# Patient Record
Sex: Male | Born: 1970 | Race: Black or African American | Hispanic: No | Marital: Single | Smoking: Never smoker
Health system: Southern US, Community
[De-identification: ages and names within clinical notes are randomized; demographics above are authoritative.]

## PROBLEM LIST (undated history)

## (undated) DIAGNOSIS — M1990 Unspecified osteoarthritis, unspecified site: Secondary | ICD-10-CM

## (undated) DIAGNOSIS — G709 Myoneural disorder, unspecified: Secondary | ICD-10-CM

## (undated) DIAGNOSIS — T7840XA Allergy, unspecified, initial encounter: Secondary | ICD-10-CM

## (undated) HISTORY — DX: Myoneural disorder, unspecified: G70.9

## (undated) HISTORY — DX: Allergy, unspecified, initial encounter: T78.40XA

## (undated) HISTORY — DX: Unspecified osteoarthritis, unspecified site: M19.90

---

## 1999-06-08 ENCOUNTER — Emergency Department (HOSPITAL_COMMUNITY): Admission: EM | Admit: 1999-06-08 | Discharge: 1999-06-08 | Payer: Self-pay | Admitting: Emergency Medicine

## 2002-07-20 ENCOUNTER — Emergency Department (HOSPITAL_COMMUNITY): Admission: EM | Admit: 2002-07-20 | Discharge: 2002-07-21 | Payer: Self-pay | Admitting: Emergency Medicine

## 2002-07-21 ENCOUNTER — Encounter: Payer: Self-pay | Admitting: Emergency Medicine

## 2010-01-30 ENCOUNTER — Emergency Department (HOSPITAL_COMMUNITY): Admission: EM | Admit: 2010-01-30 | Discharge: 2010-01-30 | Payer: Self-pay | Admitting: Emergency Medicine

## 2011-02-23 LAB — COMPREHENSIVE METABOLIC PANEL
AST: 29 U/L (ref 0–37)
Albumin: 4.3 g/dL (ref 3.5–5.2)
BUN: 7 mg/dL (ref 6–23)
Calcium: 8.9 mg/dL (ref 8.4–10.5)
Creatinine, Ser: 1.41 mg/dL (ref 0.4–1.5)
GFR calc non Af Amer: 56 mL/min — ABNORMAL LOW (ref >60)

## 2011-02-23 LAB — ABO/RH: ABO/RH(D): A POS

## 2011-02-23 LAB — POCT I-STAT, CHEM 8
Glucose, Bld: 91 mg/dL (ref 70–99)
HCT: 47 % (ref 39.0–52.0)
Potassium: 3.2 mEq/L — ABNORMAL LOW (ref 3.5–5.1)
Sodium: 138 mEq/L (ref 135–145)
TCO2: 26 mmol/L (ref 0–100)

## 2011-02-23 LAB — CBC
HCT: 44.2 % (ref 39.0–52.0)
Hemoglobin: 15.3 g/dL (ref 13.0–17.0)
MCHC: 34.6 g/dL (ref 30.0–36.0)
MCV: 91.3 fL (ref 78.0–100.0)
RBC: 4.84 MIL/uL (ref 4.22–5.81)
RDW: 13.1 % (ref 11.5–15.5)
WBC: 4.4 10*3/uL (ref 4.0–10.5)

## 2011-02-23 LAB — TYPE AND SCREEN
ABO/RH(D): A POS
Antibody Screen: NEGATIVE

## 2011-02-23 LAB — LACTIC ACID, PLASMA: Lactic Acid, Venous: 2.5 mmol/L — ABNORMAL HIGH (ref 0.5–2.2)

## 2011-02-23 LAB — PROTIME-INR: Prothrombin Time: 14 seconds (ref 11.6–15.2)

## 2011-12-27 ENCOUNTER — Emergency Department (HOSPITAL_COMMUNITY)
Admission: EM | Admit: 2011-12-27 | Discharge: 2011-12-27 | Disposition: A | Discharge status: Home / Self Care | Payer: No Typology Code available for payment source | Attending: Emergency Medicine | Admitting: Emergency Medicine

## 2011-12-27 ENCOUNTER — Emergency Department (HOSPITAL_COMMUNITY): Payer: No Typology Code available for payment source

## 2011-12-27 ENCOUNTER — Encounter (HOSPITAL_COMMUNITY): Payer: Self-pay

## 2011-12-27 DIAGNOSIS — M542 Cervicalgia: Secondary | ICD-10-CM | POA: Insufficient documentation

## 2011-12-27 DIAGNOSIS — M5380 Other specified dorsopathies, site unspecified: Secondary | ICD-10-CM | POA: Insufficient documentation

## 2011-12-27 MED ORDER — DIAZEPAM 5 MG PO TABS: 5.0000 mg | ORAL_TABLET | Freq: Three times a day (TID) | ORAL | Status: AC | PRN

## 2011-12-27 MED ORDER — IBUPROFEN 800 MG PO TABS: 800.0000 mg | ORAL_TABLET | Freq: Three times a day (TID) | ORAL | Status: AC | PRN
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
## 2011-12-28 NOTE — ED Provider Notes (Signed)
Medical screening examination/treatment/procedure(s) were performed by non-physician practitioner and as supervising physician I was immediately available for consultation/collaboration.  Celene Kras, MD 12/28/11 0800

## 2015-07-05 ENCOUNTER — Ambulatory Visit (INDEPENDENT_AMBULATORY_CARE_PROVIDER_SITE_OTHER): Payer: BLUE CROSS/BLUE SHIELD

## 2015-07-05 ENCOUNTER — Ambulatory Visit (INDEPENDENT_AMBULATORY_CARE_PROVIDER_SITE_OTHER): Payer: BLUE CROSS/BLUE SHIELD | Admitting: Physician Assistant

## 2015-07-05 VITALS — BP 110/72 | HR 73 | Temp 98.8°F | Resp 20 | Ht 67.5 in | Wt 187.2 lb

## 2015-07-05 DIAGNOSIS — K5900 Constipation, unspecified: Secondary | ICD-10-CM | POA: Diagnosis not present

## 2015-07-05 DIAGNOSIS — M62838 Other muscle spasm: Secondary | ICD-10-CM

## 2015-07-05 DIAGNOSIS — M25512 Pain in left shoulder: Secondary | ICD-10-CM

## 2015-07-05 DIAGNOSIS — M545 Low back pain, unspecified: Secondary | ICD-10-CM

## 2015-07-05 MED ORDER — CYCLOBENZAPRINE HCL ER 15 MG PO CP24: 15.0000 mg | ORAL_CAPSULE | Freq: Every day | ORAL | Status: DC | PRN

## 2015-07-05 MED ORDER — NAPROXEN SODIUM 550 MG PO TABS: 550.0000 mg | ORAL_TABLET | Freq: Two times a day (BID) | ORAL | Status: DC

## 2015-07-05 MED ORDER — POLYETHYLENE GLYCOL 3350 17 GM/SCOOP PO POWD: 1.0000 | Freq: Once | ORAL | Status: DC
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
## 2015-07-05 NOTE — Patient Instructions (Signed)
Take anaprox twice a day for 1-2 weeks. Take amrix at night for 1-2 weeks. Apply ice alternating with heat Do not use anaprox with other products containing ibuprofen, naprosyn or aspirin. You may use tylenol with this medication. If your pain is not improving in 2 weeks, let me know and I will refer you to ortho. Return for a physical.

## 2015-07-06 LAB — COMPREHENSIVE METABOLIC PANEL
ALBUMIN: 4.2 g/dL (ref 3.6–5.1)
ALK PHOS: 46 U/L (ref 40–115)
ALT: 17 U/L (ref 9–46)
AST: 27 U/L (ref 10–40)
BILIRUBIN TOTAL: 0.6 mg/dL (ref 0.2–1.2)
BUN: 10 mg/dL (ref 7–25)
CALCIUM: 9.5 mg/dL (ref 8.6–10.3)
CHLORIDE: 104 mmol/L (ref 98–110)
CO2: 23 mmol/L (ref 20–31)
CREATININE: 1.14 mg/dL (ref 0.60–1.35)
Glucose, Bld: 83 mg/dL (ref 65–99)
Potassium: 4.6 mmol/L (ref 3.5–5.3)
SODIUM: 138 mmol/L (ref 135–146)
Total Protein: 7.6 g/dL (ref 6.1–8.1)

## 2015-07-06 LAB — CBC
HCT: 42.9 % (ref 39.0–52.0)
Hemoglobin: 14.5 g/dL (ref 13.0–17.0)
MCH: 29.5 pg (ref 26.0–34.0)
MCHC: 33.8 g/dL (ref 30.0–36.0)
MCV: 87.2 fL (ref 78.0–100.0)
MPV: 9.6 fL (ref 8.6–12.4)
Platelets: 300 10*3/uL (ref 150–400)
RBC: 4.92 MIL/uL (ref 4.22–5.81)
RDW: 14.4 % (ref 11.5–15.5)
WBC: 6.2 10*3/uL (ref 4.0–10.5)

## 2015-07-14 ENCOUNTER — Ambulatory Visit (INDEPENDENT_AMBULATORY_CARE_PROVIDER_SITE_OTHER): Payer: BLUE CROSS/BLUE SHIELD | Admitting: Family Medicine

## 2015-07-14 VITALS — BP 118/70 | HR 69 | Temp 98.4°F | Resp 14 | Ht 67.5 in | Wt 199.0 lb

## 2015-07-14 DIAGNOSIS — Z125 Encounter for screening for malignant neoplasm of prostate: Secondary | ICD-10-CM | POA: Diagnosis not present

## 2015-07-14 DIAGNOSIS — S46912A Strain of unspecified muscle, fascia and tendon at shoulder and upper arm level, left arm, initial encounter: Secondary | ICD-10-CM | POA: Diagnosis not present

## 2015-07-14 DIAGNOSIS — Z23 Encounter for immunization: Secondary | ICD-10-CM

## 2015-07-14 DIAGNOSIS — Z0000 Encounter for general adult medical examination without abnormal findings: Secondary | ICD-10-CM

## 2015-07-14 DIAGNOSIS — Z13220 Encounter for screening for lipoid disorders: Secondary | ICD-10-CM | POA: Diagnosis not present

## 2015-07-14 LAB — LDL CHOLESTEROL, DIRECT: Direct LDL: 166 mg/dL — ABNORMAL HIGH (ref <130)

## 2015-07-14 NOTE — Patient Instructions (Addendum)
We will refer you to orthopedics to look at your shoulder.   I will check your cholesterol and prostate blood tests today I will send your FLMA forms to your job via our disability department.  Avoid lifting overhead,and also avoid lifting over 20 lbs

## 2015-07-14 NOTE — Progress Notes (Signed)
[facility] [address] [place] [address] [phone]  Date:  07/14/2015   Name:  Darius Barry   DOB:  1971-06-19   MRN:  [id number]  PCP:  Default, Provider, MD    Chief Complaint: Annual Exam   History of Present Illness:  Darius Barry is a 44 y.o. very pleasant male patient who presents with the following:  Here today for a "physical" and FLMA paperwork.  Recent labs earlier this month looked good.  He is not fasting today.  He is unsure of the date of last tetanus shot- would like to do today He was here at the first of August for shoulder pain- his shoulder is somewhat improved per his history but not yet well. 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  He does a lot of" manuvering and some lifting but not heavy lifting." He was seen for the same on 8/1, started on amrix and naproxen, planned to refer to ortho if not better.  He does not feel that he is yet able to RTW He is otherwise not aware of any chronic health issues.  He does not have chronic medications  There are no active problems to display for this patient.   Past Medical History  Diagnosis Date  . Allergy   . Arthritis   . Neuromuscular disorder     History reviewed. No pertinent past surgical history.  Social History  Substance Use Topics  . Smoking status: Never Smoker   . Smokeless tobacco: None     Comment: smokes the occasional cigar  . Alcohol Use: 0.0 oz/week    0 Standard drinks or equivalent per week    History reviewed. No pertinent family history.  No Known Allergies  Medication list has been reviewed and updated.  Current Outpatient Prescriptions on File Prior to Visit  Medication Sig Dispense Refill  . acetaminophen (TYLENOL) 500 MG tablet Take 500-1,000 mg by mouth every 6 (six) hours as needed.    . cyclobenzaprine (AMRIX) 15 MG  24 hr capsule Take 1 capsule (15 mg total) by mouth daily as needed for muscle spasms. 30 capsule 0  . naproxen sodium (ANAPROX DS) 550 MG tablet Take 1 tablet (550 mg total) by mouth 2 (two) times daily with a meal. 30 tablet 0  . polyethylene glycol powder (GLYCOLAX/MIRALAX) powder Take 255 g by mouth once. 250 g 0   No current facility-administered medications on file prior to visit.    Review of Systems:  As per HPI- otherwise negative.   Physical Examination: Filed Vitals:   07/14/15 0951  BP: 118/70  Pulse: 69  Temp: 98.4 F (36.9 C)  Resp: 14   Filed Vitals:   07/14/15 0951  Height: 5' 7.5" (1.715 m)  Weight: 199 lb (90.266 kg)   Body mass index is 30.69 kg/(m^2). Ideal Body Weight: Weight in (lb) to have BMI = 25: 161.7  GEN: WDWN, NAD, Non-toxic, A & O x 3, looks well HEENT: Atraumatic, Normocephalic. Neck supple. No masses, No LAD.  Bilateral TM wnl, oropharynx normal.  PEERL,EOMI.   Ears and Nose: No external deformity. CV: RRR, No M/G/R. No JVD. No thrill. No extra heart sounds. PULM: CTA B, no wheezes, crackles, rhonchi. No retractions. No resp. distress. No accessory muscle use. ABD: S, NT, ND. No rebound.  No HSM. EXTR: No c/c/e NEURO Normal gait.  PSYCH: Normally interactive. Conversant. Not depressed or anxious appearing.  Calm demeanor.  Left shoulder: he notes tenderness over the anterior RCT insertion.  Normal ROM, strength of the shoulder  Assessment and Plan: Immunization due - Plan: Tdap vaccine greater than or equal to 7yo IM  Physical exam  Left shoulder strain, initial encounter - Plan: Ambulatory referral to Orthopedic Surgery  Screening for hyperlipidemia - Plan: LDL cholesterol, direct  Screening for prostate cancer - Plan: PSA   Vague shoulder discomfort.  Advised that it would be wise to get him back to work soon, but we can do restrictions and build in time for ortho and PT visits on his FMLA. He plans to RTW next Monday Did his  tdap, the rest os his labs Will plan further follow- up pending labs.  Signed Abbe Amsterdam, MD

## 2015-07-15 ENCOUNTER — Encounter: Payer: Self-pay | Admitting: Family Medicine

## 2015-07-15 LAB — PSA: PSA: 0.24 ng/mL (ref <=4.00)

## 2016-10-19 ENCOUNTER — Ambulatory Visit (INDEPENDENT_AMBULATORY_CARE_PROVIDER_SITE_OTHER): Payer: BLUE CROSS/BLUE SHIELD | Admitting: Family Medicine

## 2016-10-19 VITALS — BP 126/74 | HR 62 | Temp 98.4°F | Resp 18 | Ht 67.5 in | Wt 197.0 lb

## 2016-10-19 DIAGNOSIS — Z0000 Encounter for general adult medical examination without abnormal findings: Secondary | ICD-10-CM

## 2016-10-19 DIAGNOSIS — Z125 Encounter for screening for malignant neoplasm of prostate: Secondary | ICD-10-CM

## 2016-10-19 DIAGNOSIS — Z23 Encounter for immunization: Secondary | ICD-10-CM

## 2016-10-19 DIAGNOSIS — E785 Hyperlipidemia, unspecified: Secondary | ICD-10-CM

## 2016-10-19 DIAGNOSIS — Z113 Encounter for screening for infections with a predominantly sexual mode of transmission: Secondary | ICD-10-CM

## 2016-10-19 LAB — COMPREHENSIVE METABOLIC PANEL
ALBUMIN: 4.3 g/dL (ref 3.6–5.1)
ALT: 20 U/L (ref 9–46)
AST: 28 U/L (ref 10–40)
Alkaline Phosphatase: 43 U/L (ref 40–115)
BILIRUBIN TOTAL: 1.1 mg/dL (ref 0.2–1.2)
BUN: 18 mg/dL (ref 7–25)
CHLORIDE: 103 mmol/L (ref 98–110)
CO2: 27 mmol/L (ref 20–31)
CREATININE: 1.13 mg/dL (ref 0.60–1.35)
Calcium: 9.5 mg/dL (ref 8.6–10.3)
Glucose, Bld: 93 mg/dL (ref 65–99)
Potassium: 4.3 mmol/L (ref 3.5–5.3)
SODIUM: 137 mmol/L (ref 135–146)
TOTAL PROTEIN: 7.5 g/dL (ref 6.1–8.1)

## 2016-10-19 LAB — LIPID PANEL
CHOLESTEROL: 224 mg/dL — AB (ref <200)
HDL: 57 mg/dL (ref >40)
LDL Cholesterol: 155 mg/dL — ABNORMAL HIGH (ref <100)
TRIGLYCERIDES: 62 mg/dL (ref <150)
Total CHOL/HDL Ratio: 3.9 Ratio (ref <5.0)
VLDL: 12 mg/dL (ref <30)

## 2016-10-19 LAB — HIV ANTIBODY (ROUTINE TESTING W REFLEX): HIV 1&2 Ab, 4th Generation: NONREACTIVE

## 2016-10-19 NOTE — Progress Notes (Signed)
Chief Complaint  Patient presents with  . Annual Exam    Subjective:  Darius Barry is a 45 y.o. male here for a health maintenance visit.  Patient is established pt  There are no active problems to display for this patient.   Past Medical History:  Diagnosis Date  . Allergy   . Arthritis   . Neuromuscular disorder (HCC)     History reviewed. No pertinent surgical history.   Outpatient Medications Prior to Visit  Medication Sig Dispense Refill  . cyclobenzaprine (AMRIX) 15 MG 24 hr capsule Take 1 capsule (15 mg total) by mouth daily as needed for muscle spasms. (Patient not taking: Reported on 10/19/2016) 30 capsule 0  . acetaminophen (TYLENOL) 500 MG tablet Take 500-1,000 mg by mouth every 6 (six) hours as needed.    . naproxen sodium (ANAPROX DS) 550 MG tablet Take 1 tablet (550 mg total) by mouth 2 (two) times daily with a meal. (Patient not taking: Reported on 10/19/2016) 30 tablet 0  . polyethylene glycol powder (GLYCOLAX/MIRALAX) powder Take 255 g by mouth once. (Patient not taking: Reported on 10/19/2016) 250 g 0   No facility-administered medications prior to visit.     No Known Allergies   History reviewed. No pertinent family history.   Health Habits: Dental Exam: up to date Eye Exam: up to date Exercise:  times/week on average Current exercise activities: walking/running Diet: balanced Tobacco: none  Social History   Social History  . Marital status: Single    Spouse name: N/A  . Number of children: N/A  . Years of education: N/A   Occupational History  . Not on file.   Social History Main Topics  . Smoking status: Never Smoker  . Smokeless tobacco: Never Used     Comment: smokes the occasional cigar  . Alcohol use 0.0 oz/week  . Drug use: No  . Sexual activity: Not on file   Other Topics Concern  . Not on file   Social History Narrative  . No narrative on file   History  Alcohol Use  . 0.0 oz/week   History  Smoking Status  .  Never Smoker  Smokeless Tobacco  . Never Used    Comment: smokes the occasional cigar   History  Drug Use No     Health Maintenance: See under health Maintenance activity for review of completion dates as well. Immunization History  Administered Date(s) Administered  . Influenza,inj,Quad PF,36+ Mos 10/19/2016  . Tdap 07/14/2015      Depression Screen-PHQ2/9 [facility]2016 07/14/2015  Decreased Interest 0 0  Down, Depressed, Hopeless 0 0  PHQ - 2 Score 0 0      Depression Severity and Treatment Recommendations:  0-4= None  5-9= Mild / Treatment: Support, educate to call if worse; return in one month  10-14= Moderate / Treatment: Support, watchful waiting; Antidepressant or Psycotherapy  15-19= Moderately severe / Treatment: Antidepressant OR Psychotherapy  >= 20 = Major depression, severe / Antidepressant AND Psychotherapy    Review of Systems   Review of Systems  Constitutional: Negative for chills, fever and weight loss.  HENT: Negative for congestion, hearing loss and nosebleeds.   Eyes: Negative for blurred vision and double vision.  Respiratory: Negative for cough, hemoptysis and wheezing.   Cardiovascular: Negative for chest pain, palpitations and orthopnea.  Gastrointestinal: Negative for abdominal pain, blood in stool, nausea and vomiting.  Genitourinary: Negative for dysuria, frequency and urgency.  Musculoskeletal: Negative for back pain,  joint pain, myalgias and neck pain.  Skin: Negative for itching and rash.  Neurological: Negative for dizziness, tingling, tremors and headaches.  Psychiatric/Behavioral: Negative for depression. The patient is not nervous/anxious and does not have insomnia.     See HPI for ROS as well.    Objective:   Vitals:   10/19/16 0832  BP: 126/74  Pulse: 62  Resp: 18  Temp: 98.4 F (36.9 C)  TempSrc: Oral  SpO2: 99%  Weight: 197 lb (89.4 kg)  Height: 5' 7.5" (1.715 m)    Body mass index is  30.4 kg/m.  Physical Exam  Constitutional: He is oriented to person, place, and time. He appears well-developed and well-nourished.  HENT:  Head: Normocephalic and atraumatic.  Eyes: Conjunctivae and EOM are normal. Pupils are equal, round, and reactive to light.  Neck: Normal range of motion. Neck supple.  Cardiovascular: Normal rate, regular rhythm and normal heart sounds.   No murmur heard. Pulmonary/Chest: Effort normal and breath sounds normal. No respiratory distress. He has no wheezes.  Abdominal: Soft. Bowel sounds are normal. He exhibits no distension and no mass. There is no tenderness. There is no rebound and no guarding.  Musculoskeletal: Normal range of motion. He exhibits no edema.  Neurological: He is alert and oriented to person, place, and time. He has normal reflexes. No cranial nerve deficit.  Skin: Skin is warm. No erythema.  Psychiatric: He has a normal mood and affect. His behavior is normal. Judgment and thought content normal.       Assessment/Plan:   Patient was seen for a health maintenance exam.  Counseled the patient on health maintenance issues. Reviewed her health mainteance schedule and ordered appropriate tests (see orders.) Counseled on regular exercise and weight management. Recommend regular eye exams and dental cleaning.   The following issues were addressed today for health maintenance:   Darius Barry was seen today for annual exam.  Diagnoses and all orders for this visit:  Health maintenance examination- age appropriate screenings reviewed Advised eye and dental exam -     Cancel: PSA -     Comprehensive metabolic panel -     Lipid panel  Need for prophylactic vaccination and inoculation against influenza -     Flu Vaccine QUAD 36+ mos IM  Dyslipidemia- discussed lifestyle changes, advised fish oil to improve hdl and fiber to decrease LDL cholesterol  Screening for prostate cancer -     PSA  Screen for STD (sexually transmitted disease)-  verbal consent given for std today -     Cancel: GC/Chlamydia Probe Amp -     HIV antibody -     RPR -     GC/Chlamydia Probe Amp    No Follow-up on file.    Body mass index is 30.4 kg/m.:  Discussed the patient's BMI with patient. The BMI body mass index is 30.4 kg/m.     No future appointments.  Patient Instructions       IF you received an x-ray today, you will receive an invoice from [facility]. Please contact [facility] at [phone] with questions or concerns regarding your invoice.   IF you received labwork today, you will receive an invoice from [employer] [facility]. Please contact [facility] at [phone] with questions or concerns regarding your invoice.   Our billing staff will not be able to assist you with questions regarding bills from these companies.  You will be contacted with the lab results as soon as they are available.  The fastest way to get your results is to activate your My Chart account. Instructions are located on the last page of this paperwork. If you have not heard from [place] regarding the results in 2 weeks, please contact this office.     Dyslipidemia Dyslipidemia is an imbalance of waxy, fat-like substances (lipids) in the blood. The body needs lipids in small amounts. Dyslipidemia often involves a high level of cholesterol or triglycerides, which are types of lipids. Common forms of dyslipidemia include:  High levels of bad cholesterol (LDL cholesterol). LDL is the type of cholesterol that causes fatty deposits (plaques) to build up in the blood vessels that carry blood away from your heart (arteries).  Low levels of good cholesterol (HDL cholesterol). HDL cholesterol is the type of cholesterol that protects against heart disease. High levels of HDL remove the LDL buildup from arteries.  High levels of triglycerides. Triglycerides are a fatty substance in the blood that is linked to a buildup of plaques in  the arteries. You can develop dyslipidemia because of the genes you are born with (primary dyslipidemia) or changes that occur during your life (secondary dyslipidemia), or as a side effect of certain medical treatments. What are the causes? Primary dyslipidemia is caused by changes (mutations) in genes that are passed down through families (inherited). These mutations cause several types of dyslipidemia. Mutations can result in disorders that make the body produce too much LDL cholesterol or triglycerides, or not enough HDL cholesterol. These disorders may lead to heart disease, arterial disease, or stroke at an early age. Causes of secondary dyslipidemia include certain lifestyle choices and diseases that lead to dyslipidemia, such as:  Eating a diet that is high in animal fat.  Not getting enough activity or exercise (having a sedentary lifestyle).  Having diabetes, kidney disease, liver disease, or thyroid disease.  Drinking large amounts of alcohol.  Using certain types of drugs. What increases the risk? You may be at greater risk for dyslipidemia if you are an older man or if you are a woman who has gone through menopause. Other risk factors include:  Having a family history of dyslipidemia.  Taking certain medicines, including birth control pills, steroids, some diuretics, beta-blockers, and some medicines forHIV.  Smoking cigarettes.  Eating a high-fat diet.  Drinking large amounts of alcohol.  Having certain medical conditions such as diabetes, polycystic ovary syndrome (PCOS), pregnancy, kidney disease, liver disease, or hypothyroidism.  Not exercising regularly.  Being overweight or obese with too much belly fat. What are the signs or symptoms? Dyslipidemia does not usually cause any symptoms. Very high lipid levels can cause fatty bumps under the skin (xanthomas) or a white or gray ring around the black center (pupil) of the eye. Very high triglyceride levels can cause  inflammation of the pancreas (pancreatitis). How is this diagnosed? Your health care provider may diagnose dyslipidemia based on a routine blood test (fasting blood test). Because most people do not have symptoms of the condition, this blood testing (lipid profile) is done on adults age 67 and older and is repeated every 5 years. This test checks:  Total cholesterol. This is a measure of the total amount of cholesterol in your blood, including LDL cholesterol, HDL cholesterol, and triglycerides. A healthy number is below 200.  LDL cholesterol. The target number for LDL cholesterol is different for each person, depending on individual risk factors. For most people, a number below 100 is healthy. Ask your health care provider what your LDL cholesterol  number should be.  HDL cholesterol. An HDL level of 60 or higher is best because it helps to protect against heart disease. A number below 40 for men or below 50 for women increases the risk for heart disease.  Triglycerides. A healthy triglyceride number is below 150. If your lipid profile is abnormal, your health care provider may do other blood tests to get more information about your condition. How is this treated? Treatment depends on the type of dyslipidemia that you have and your other risk factors for heart disease and stroke. Your health care provider will have a target range for your lipid levels based on this information. For many people, treatment starts with lifestyle changes, such as diet and exercise. Your health care provider may recommend that you:  Get regular exercise.  Make changes to your diet.  Quit smoking if you smoke. If diet changes and exercise do not help you reach your goals, your health care provider may also prescribe medicine to lower lipids. The most commonly prescribed type of medicine lowers your LDL cholesterol (statin drug). If you have a high triglyceride level, your provider may prescribe another type of drug  (fibrate) or an omega-3 fish oil supplement, or both. Follow these instructions at home:  Take over-the-counter and prescription medicines only as told by your health care provider. This includes supplements.  Get regular exercise. Start an aerobic exercise and strength training program as told by your health care provider. Ask your health care provider what activities are safe for you. Your health care provider may recommend:  30 minutes of aerobic activity 4-6 days a week. Brisk walking is an example of aerobic activity.  Strength training 2 days a week.  Eat a healthy diet as told by your health care provider. This can help you reach and maintain a healthy weight, lower your LDL cholesterol, and raise your HDL cholesterol. It may help to work with a diet and nutrition specialist (dietitian) to make a plan that is right for you. Your dietitian or health care provider may recommend:  Limiting your calories, if you are overweight.  Eating more fruits, vegetables, whole grains, fish, and lean meats.  Limiting saturated fat, trans fat, and cholesterol.  Follow instructions from your health care provider or dietitian about eating or drinking restrictions.  Limit alcohol intake to no more than one drink per day for nonpregnant women and two drinks per day for men. One drink equals 12 oz of beer, 5 oz of wine, or 1 oz of hard liquor.  Do not use any products that contain nicotine or tobacco, such as cigarettes and e-cigarettes. If you need help quitting, ask your health care provider.  Keep all follow-up visits as told by your health care provider. This is important. Contact a health care provider if:  You are having trouble sticking to your exercise or diet plan.  You are struggling to quit smoking or control your use of alcohol. Summary  Dyslipidemia is an imbalance of waxy, fat-like substances (lipids) in the blood. The body needs lipids in small amounts. Dyslipidemia often involves a  high level of cholesterol or triglycerides, which are types of lipids.  Treatment depends on the type of dyslipidemia that you have and your other risk factors for heart disease and stroke.  For many people, treatment starts with lifestyle changes, such as diet and exercise. Your health care provider may also prescribe medicine to lower lipids. This information is not intended to replace advice given to you  by your health care provider. Make sure you discuss any questions you have with your health care provider. Document Released: 11/25/2013 Document Revised: 07/17/2016 Document Reviewed: 07/17/2016 Elsevier Interactive Patient Education  2017 [employer]

## 2016-10-19 NOTE — Patient Instructions (Addendum)
   IF you received an x-ray today, you will receive an invoice from [facility]. Please contact [facility] at [phone] with questions or concerns regarding your invoice.   IF you received labwork today, you will receive an invoice from [facility]. Please contact [facility] at [phone] with questions or concerns regarding your invoice.   Our billing staff will not be able to assist you with questions regarding bills from these companies.  You will be contacted with the lab results as soon as they are available. The fastest way to get your results is to activate your My Chart account. Instructions are located on the last page of this paperwork. If you have not heard from us regarding the results in 2 weeks, please contact this office.      Dyslipidemia Dyslipidemia is an imbalance of waxy, fat-like substances (lipids) in the blood. The body needs lipids in small amounts. Dyslipidemia often involves a high level of cholesterol or triglycerides, which are types of lipids. Common forms of dyslipidemia include:  High levels of bad cholesterol (LDL cholesterol). LDL is the type of cholesterol that causes fatty deposits (plaques) to build up in the blood vessels that carry blood away from your heart (arteries).  Low levels of good cholesterol (HDL cholesterol). HDL cholesterol is the type of cholesterol that protects against heart disease. High levels of HDL remove the LDL buildup from arteries.  High levels of triglycerides. Triglycerides are a fatty substance in the blood that is linked to a buildup of plaques in the arteries. You can develop dyslipidemia because of the genes you are born with (primary dyslipidemia) or changes that occur during your life (secondary dyslipidemia), or as a side effect of certain medical treatments. What are the causes? Primary dyslipidemia is caused by changes (mutations) in genes that are passed down through  families (inherited). These mutations cause several types of dyslipidemia. Mutations can result in disorders that make the body produce too much LDL cholesterol or triglycerides, or not enough HDL cholesterol. These disorders may lead to heart disease, arterial disease, or stroke at an early age. Causes of secondary dyslipidemia include certain lifestyle choices and diseases that lead to dyslipidemia, such as:  Eating a diet that is high in animal fat.  Not getting enough activity or exercise (having a sedentary lifestyle).  Having diabetes, kidney disease, liver disease, or thyroid disease.  Drinking large amounts of alcohol.  Using certain types of drugs. What increases the risk? You may be at greater risk for dyslipidemia if you are an older man or if you are a woman who has gone through menopause. Other risk factors include:  Having a family history of dyslipidemia.  Taking certain medicines, including birth control pills, steroids, some diuretics, beta-blockers, and some medicines forHIV.  Smoking cigarettes.  Eating a high-fat diet.  Drinking large amounts of alcohol.  Having certain medical conditions such as diabetes, polycystic ovary syndrome (PCOS), pregnancy, kidney disease, liver disease, or hypothyroidism.  Not exercising regularly.  Being overweight or obese with too much belly fat. What are the signs or symptoms? Dyslipidemia does not usually cause any symptoms. Very high lipid levels can cause fatty bumps under the skin (xanthomas) or a white or gray ring around the black center (pupil) of the eye. Very high triglyceride levels can cause inflammation of the pancreas (pancreatitis). How is this diagnosed? Your health care provider may diagnose dyslipidemia based on a routine blood test (fasting blood test). Because most people do not   have symptoms of the condition, this blood testing (lipid profile) is done on adults age 20 and older and is repeated every 5 years.  This test checks:  Total cholesterol. This is a measure of the total amount of cholesterol in your blood, including LDL cholesterol, HDL cholesterol, and triglycerides. A healthy number is below 200.  LDL cholesterol. The target number for LDL cholesterol is different for each person, depending on individual risk factors. For most people, a number below 100 is healthy. Ask your health care provider what your LDL cholesterol number should be.  HDL cholesterol. An HDL level of 60 or higher is best because it helps to protect against heart disease. A number below 40 for men or below 50 for women increases the risk for heart disease.  Triglycerides. A healthy triglyceride number is below 150. If your lipid profile is abnormal, your health care provider may do other blood tests to get more information about your condition. How is this treated? Treatment depends on the type of dyslipidemia that you have and your other risk factors for heart disease and stroke. Your health care provider will have a target range for your lipid levels based on this information. For many people, treatment starts with lifestyle changes, such as diet and exercise. Your health care provider may recommend that you:  Get regular exercise.  Make changes to your diet.  Quit smoking if you smoke. If diet changes and exercise do not help you reach your goals, your health care provider may also prescribe medicine to lower lipids. The most commonly prescribed type of medicine lowers your LDL cholesterol (statin drug). If you have a high triglyceride level, your provider may prescribe another type of drug (fibrate) or an omega-3 fish oil supplement, or both. Follow these instructions at home:  Take over-the-counter and prescription medicines only as told by your health care provider. This includes supplements.  Get regular exercise. Start an aerobic exercise and strength training program as told by your health care provider. Ask  your health care provider what activities are safe for you. Your health care provider may recommend:  30 minutes of aerobic activity 4-6 days a week. Brisk walking is an example of aerobic activity.  Strength training 2 days a week.  Eat a healthy diet as told by your health care provider. This can help you reach and maintain a healthy weight, lower your LDL cholesterol, and raise your HDL cholesterol. It may help to work with a diet and nutrition specialist (dietitian) to make a plan that is right for you. Your dietitian or health care provider may recommend:  Limiting your calories, if you are overweight.  Eating more fruits, vegetables, whole grains, fish, and lean meats.  Limiting saturated fat, trans fat, and cholesterol.  Follow instructions from your health care provider or dietitian about eating or drinking restrictions.  Limit alcohol intake to no more than one drink per day for nonpregnant women and two drinks per day for men. One drink equals 12 oz of beer, 5 oz of wine, or 1 oz of hard liquor.  Do not use any products that contain nicotine or tobacco, such as cigarettes and e-cigarettes. If you need help quitting, ask your health care provider.  Keep all follow-up visits as told by your health care provider. This is important. Contact a health care provider if:  You are having trouble sticking to your exercise or diet plan.  You are struggling to quit smoking or control your use of alcohol.   Summary  Dyslipidemia is an imbalance of waxy, fat-like substances (lipids) in the blood. The body needs lipids in small amounts. Dyslipidemia often involves a high level of cholesterol or triglycerides, which are types of lipids.  Treatment depends on the type of dyslipidemia that you have and your other risk factors for heart disease and stroke.  For many people, treatment starts with lifestyle changes, such as diet and exercise. Your health care provider may also prescribe medicine  to lower lipids. This information is not intended to replace advice given to you by your health care provider. Make sure you discuss any questions you have with your health care provider. Document Released: 11/25/2013 Document Revised: 07/17/2016 Document Reviewed: 07/17/2016 Elsevier Interactive Patient Education  2017 [employer] Inc.  

## 2016-10-20 LAB — RPR

## 2016-10-20 LAB — GC/CHLAMYDIA PROBE AMP
CT PROBE, AMP APTIMA: NOT DETECTED
GC PROBE AMP APTIMA: NOT DETECTED

## 2016-10-20 LAB — PSA: PSA: 0.2 ng/mL (ref <=4.0)

## 2016-10-20 NOTE — Addendum Note (Signed)
Addended by: Collie SiadSTALLINGS, ZOE A on: 10/20/2016 10:01 AM   Modules accepted: Orders

## 2020-03-18 ENCOUNTER — Ambulatory Visit (INDEPENDENT_AMBULATORY_CARE_PROVIDER_SITE_OTHER): Payer: Managed Care, Other (non HMO)

## 2020-03-18 ENCOUNTER — Ambulatory Visit (HOSPITAL_COMMUNITY)
Admission: EM | Admit: 2020-03-18 | Discharge: 2020-03-18 | Disposition: A | Discharge status: Home / Self Care | Payer: Managed Care, Other (non HMO) | Attending: Urgent Care | Admitting: Urgent Care

## 2020-03-18 ENCOUNTER — Encounter (HOSPITAL_COMMUNITY): Payer: Self-pay

## 2020-03-18 ENCOUNTER — Other Ambulatory Visit: Payer: Self-pay

## 2020-03-18 DIAGNOSIS — Z20822 Contact with and (suspected) exposure to covid-19: Secondary | ICD-10-CM | POA: Diagnosis present

## 2020-03-18 DIAGNOSIS — J189 Pneumonia, unspecified organism: Secondary | ICD-10-CM | POA: Diagnosis not present

## 2020-03-18 DIAGNOSIS — R079 Chest pain, unspecified: Secondary | ICD-10-CM | POA: Diagnosis present

## 2020-03-18 DIAGNOSIS — R0989 Other specified symptoms and signs involving the circulatory and respiratory systems: Secondary | ICD-10-CM | POA: Diagnosis present

## 2020-03-18 DIAGNOSIS — R05 Cough: Secondary | ICD-10-CM | POA: Insufficient documentation

## 2020-03-18 DIAGNOSIS — R059 Cough, unspecified: Secondary | ICD-10-CM

## 2020-03-18 MED ORDER — AZITHROMYCIN 250 MG PO TABS: ORAL_TABLET | ORAL | 0 refills | Status: AC

## 2020-03-18 MED ORDER — CEFDINIR 300 MG PO CAPS: 300.0000 mg | ORAL_CAPSULE | Freq: Two times a day (BID) | ORAL | 0 refills | Status: AC

## 2020-03-18 MED ORDER — PROMETHAZINE-DM 6.25-15 MG/5ML PO SYRP: 5.0000 mL | ORAL_SOLUTION | Freq: Every evening | ORAL | 0 refills | Status: AC | PRN

## 2020-03-18 MED ORDER — BENZONATATE 100 MG PO CAPS: 100.0000 mg | ORAL_CAPSULE | Freq: Three times a day (TID) | ORAL | 0 refills | Status: AC | PRN

## 2020-03-18 NOTE — ED Triage Notes (Signed)
Pt presents for covid testing with no known symptoms. 

## 2020-03-18 NOTE — ED Provider Notes (Signed)
[facility]   MRN: [id number] DOB: August 01, 1971  Subjective:   Darius Barry is a 49 y.o. male presenting for 2-week history of persistent and worsening cough that elicits chest pain and shortness of breath.  Patient has also had chills.  Has had multiple COVID-19 contacts at work.  Denies smoking history.  Denies history of COPD, asthma.  He does have a history of allergies but per patient are very mild.   No current facility-administered medications for this encounter. No current outpatient medications on file.   No Known Allergies  Past Medical History:  Diagnosis Date  . Allergy   . Arthritis   . Neuromuscular disorder (HCC)      History reviewed. No pertinent surgical history.  History reviewed. No pertinent family history.  Social History   Tobacco Use  . Smoking status: Never Smoker  . Smokeless tobacco: Never Used  . Tobacco comment: smokes the occasional cigar  Substance Use Topics  . Alcohol use: Yes    Alcohol/week: 0.0 standard drinks  . Drug use: No    ROS   Objective:   Vitals: BP 126/71 (BP Location: Left Arm)   Pulse 60   Temp 98.7 F (37.1 C) (Oral)   Resp 18   SpO2 98%   Physical Exam Constitutional:      General: He is not in acute distress.    Appearance: Normal appearance. He is well-developed. He is not ill-appearing, toxic-appearing or diaphoretic.  HENT:     Head: Normocephalic and atraumatic.     Right Ear: External ear normal.     Left Ear: External ear normal.     Nose: Nose normal.     Mouth/Throat:     Mouth: Mucous membranes are moist.     Pharynx: Oropharynx is clear.  Eyes:     General: No scleral icterus.    Extraocular Movements: Extraocular movements intact.     Pupils: Pupils are equal, round, and reactive to light.  Cardiovascular:     Rate and Rhythm: Normal rate and regular rhythm.     Heart sounds: Normal heart sounds. No murmur. No friction rub. No gallop.   Pulmonary:     Effort: Pulmonary  effort is normal. No respiratory distress.     Breath sounds: No stridor. Examination of the left-middle field reveals rales. Examination of the left-lower field reveals rales. Rales present. No wheezing or rhonchi.  Neurological:     Mental Status: He is alert and oriented to person, place, and time.  Psychiatric:        Mood and Affect: Mood normal.        Behavior: Behavior normal.        Thought Content: Thought content normal.     DG Chest 2 View  Result Date: 03/18/2020 CLINICAL DATA:  Cough, chest pain. EXAM: CHEST - 2 VIEW COMPARISON:  January 30, 2010. FINDINGS: The heart size and mediastinal contours are within normal limits. Both lungs are clear. No pneumothorax or pleural effusion is noted. The visualized skeletal structures are unremarkable. IMPRESSION: No active cardiopulmonary disease. Electronically Signed   By: Lupita Raider M.D.   On: 03/18/2020 13:40     Assessment and Plan :   PDMP not reviewed this encounter.  1. Community acquired pneumonia of left lung, unspecified part of lung   2. Exposure to COVID-19 virus   3. Chest rales   4. Cough   5. Chest pain, unspecified type     Given patient's lung  exam and symptoms that will cover for community-acquired pneumonia with cefdinir and azithromycin as per up-to-date.  Recommended supportive care, cough suppression medications.  COVID-19 testing is pending. Counseled patient on potential for adverse effects with medications prescribed/recommended today, ER and return-to-clinic precautions discussed, patient verbalized understanding.    Jaynee Eagles, PA-C 03/18/20 [id number]

## 2020-03-18 NOTE — Discharge Instructions (Addendum)

## 2020-03-19 LAB — SARS CORONAVIRUS 2 (TAT 6-24 HRS): SARS Coronavirus 2: NEGATIVE

## 2020-11-22 ENCOUNTER — Ambulatory Visit: Payer: Managed Care, Other (non HMO)

## 2020-11-22 ENCOUNTER — Ambulatory Visit: Payer: Managed Care, Other (non HMO) | Attending: Critical Care Medicine

## 2020-11-22 ENCOUNTER — Other Ambulatory Visit: Payer: Self-pay

## 2020-11-22 NOTE — Progress Notes (Signed)
   Covid-19 Vaccination Clinic  Name:  UZAIR GODLEY    MRN: [id number] DOB: 11/04/1971  11/22/2020  Mr. Bruntz was observed post Covid-19 immunization for 15 minutes without incident. He was provided with Vaccine Information Sheet and instruction to access the V-Safe system.   Mr. Harvel was instructed to call 911 with any severe reactions post vaccine: Marland Kitchen Difficulty breathing  . Swelling of face and throat  . A fast heartbeat  . A bad rash all over body  . Dizziness and weakness

## 2021-10-01 IMAGING — DX DG CHEST 2V
2 series · 2 of 2 positions shown · non-contrast
Comparison: January 30, 2010.

CLINICAL DATA: Cough, chest pain.

EXAM:
CHEST - 2 VIEW

[chest pa]
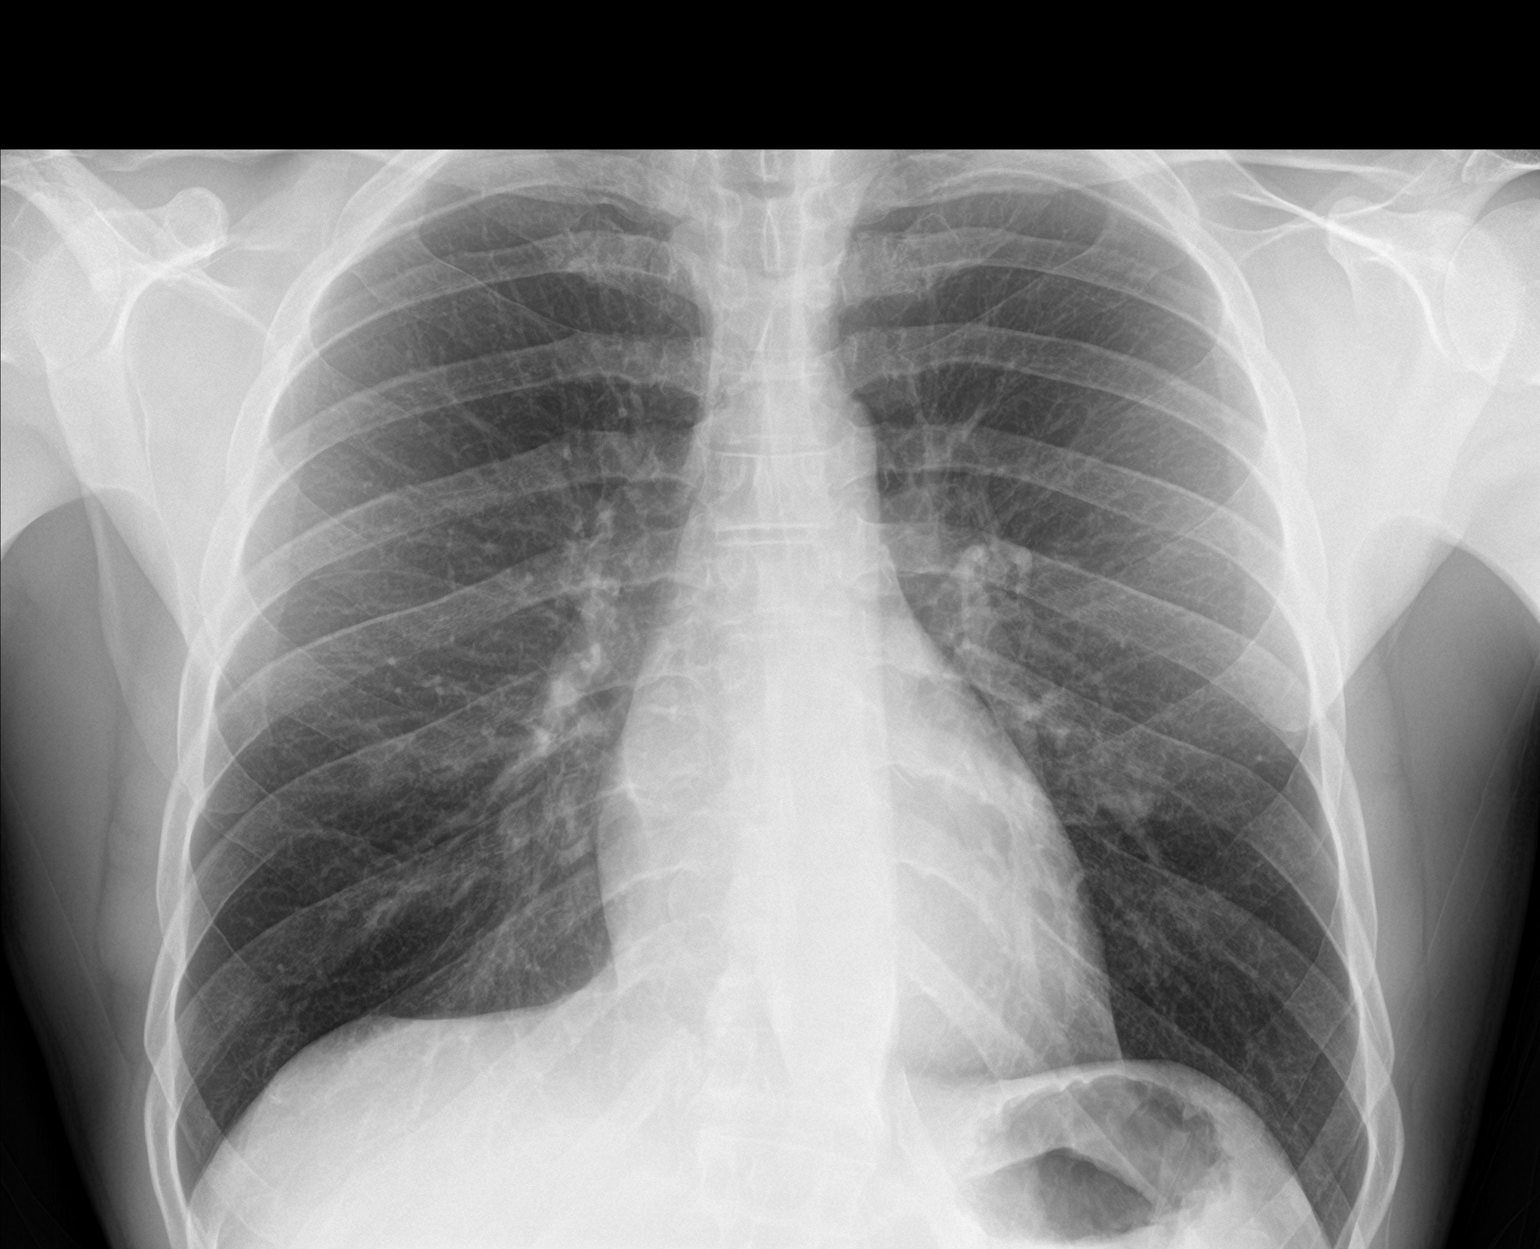

[chest lat]
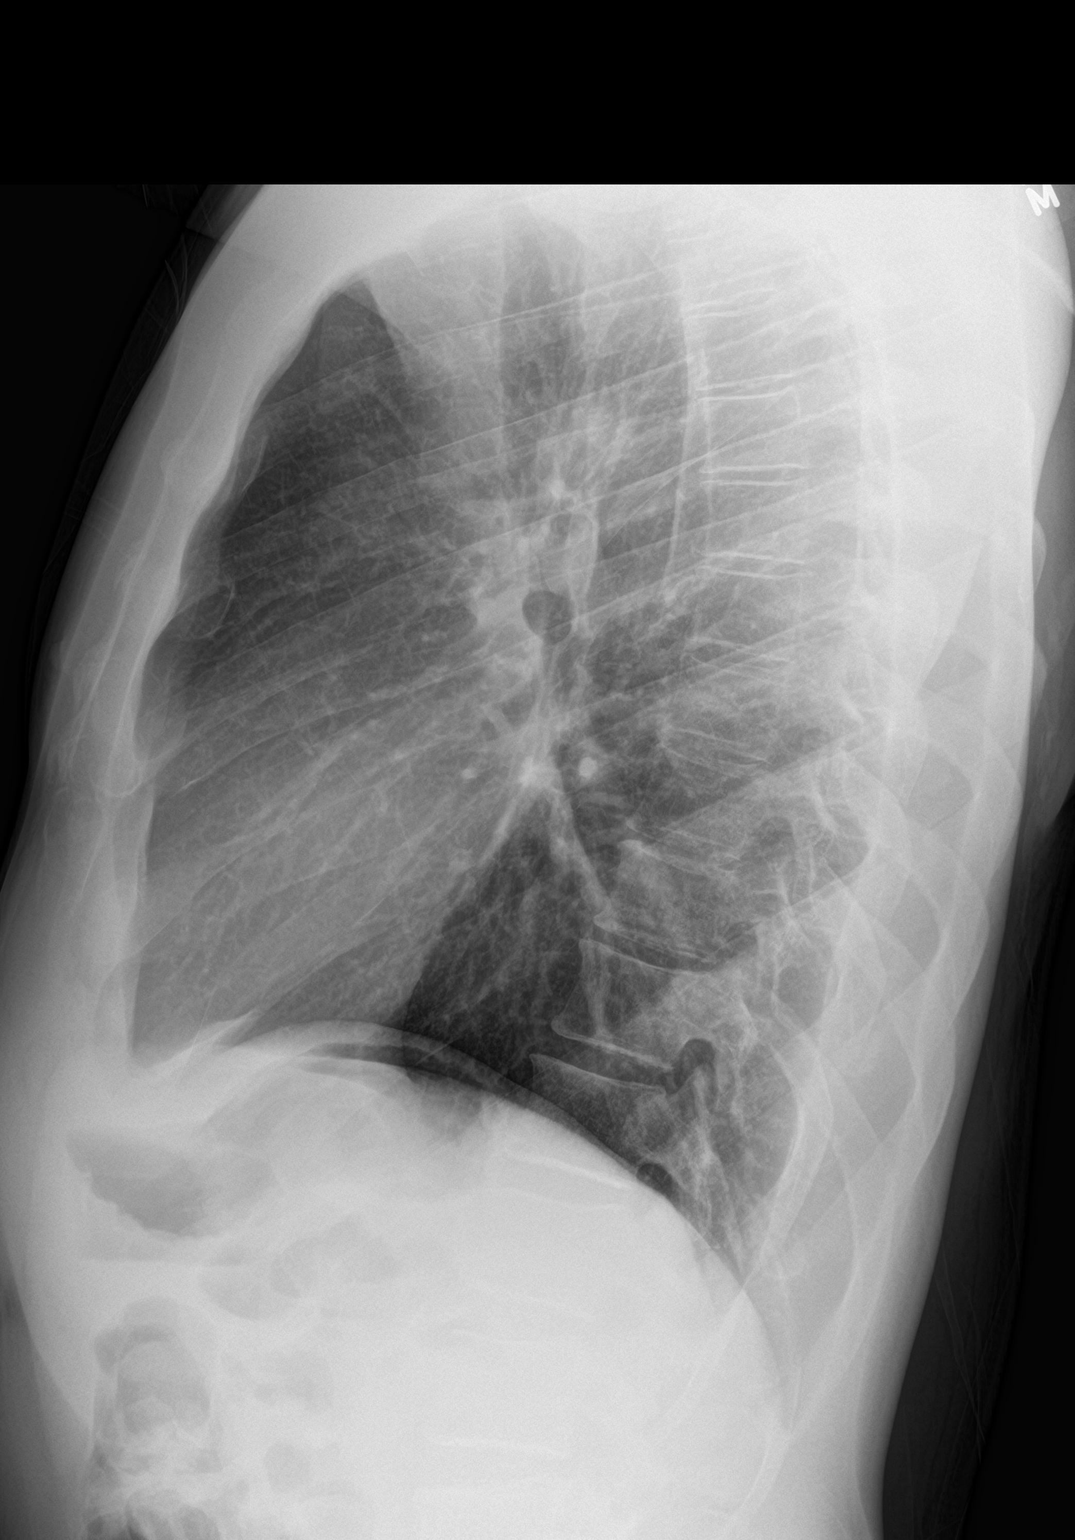

[2 of 2 positions shown; findings below may reference images not displayed]

FINDINGS: The heart size and mediastinal contours are within normal limits.
Both lungs are clear. No pneumothorax or pleural effusion is noted.
The visualized skeletal structures are unremarkable.
IMPRESSION: No active cardiopulmonary disease.
